# Patient Record
Sex: Female | Born: 1975 | Race: White | Hispanic: No | Marital: Single | Smoking: Current every day smoker
Health system: Southern US, Community
[De-identification: ages and names within clinical notes are randomized; demographics above are authoritative.]

## PROBLEM LIST (undated history)

## (undated) ENCOUNTER — Inpatient Hospital Stay (HOSPITAL_COMMUNITY): Payer: Self-pay

## (undated) DIAGNOSIS — E119 Type 2 diabetes mellitus without complications: Secondary | ICD-10-CM

## (undated) HISTORY — PX: NO PAST SURGERIES: SHX2092

---

## 2002-10-25 ENCOUNTER — Encounter: Payer: Self-pay | Admitting: Family Medicine

## 2002-10-25 ENCOUNTER — Ambulatory Visit (HOSPITAL_COMMUNITY): Admission: RE | Admit: 2002-10-25 | Discharge: 2002-10-25 | Payer: Self-pay | Admitting: Family Medicine

## 2012-06-25 DIAGNOSIS — R05 Cough: Secondary | ICD-10-CM

## 2016-03-28 ENCOUNTER — Inpatient Hospital Stay (HOSPITAL_COMMUNITY): Payer: Federal, State, Local not specified - PPO

## 2016-03-28 ENCOUNTER — Encounter (HOSPITAL_COMMUNITY): Payer: Self-pay | Admitting: *Deleted

## 2016-03-28 ENCOUNTER — Inpatient Hospital Stay (HOSPITAL_COMMUNITY)
Admission: AD | Admit: 2016-03-28 | Discharge: 2016-03-28 | Disposition: A | Discharge status: Home / Self Care | Payer: Federal, State, Local not specified - PPO | Source: Ambulatory Visit | Attending: Obstetrics & Gynecology | Admitting: Obstetrics & Gynecology

## 2016-03-28 DIAGNOSIS — B9689 Other specified bacterial agents as the cause of diseases classified elsewhere: Secondary | ICD-10-CM

## 2016-03-28 DIAGNOSIS — N760 Acute vaginitis: Secondary | ICD-10-CM | POA: Insufficient documentation

## 2016-03-28 DIAGNOSIS — O23591 Infection of other part of genital tract in pregnancy, first trimester: Secondary | ICD-10-CM | POA: Diagnosis not present

## 2016-03-28 DIAGNOSIS — Z3A08 8 weeks gestation of pregnancy: Secondary | ICD-10-CM | POA: Diagnosis not present

## 2016-03-28 DIAGNOSIS — O26899 Other specified pregnancy related conditions, unspecified trimester: Secondary | ICD-10-CM

## 2016-03-28 DIAGNOSIS — R109 Unspecified abdominal pain: Secondary | ICD-10-CM

## 2016-03-28 DIAGNOSIS — A499 Bacterial infection, unspecified: Secondary | ICD-10-CM | POA: Diagnosis not present

## 2016-03-28 HISTORY — DX: Type 2 diabetes mellitus without complications: E11.9

## 2016-03-28 LAB — URINALYSIS, ROUTINE W REFLEX MICROSCOPIC
Bilirubin Urine: NEGATIVE
Glucose, UA: NEGATIVE mg/dL
Ketones, ur: NEGATIVE mg/dL
Nitrite: NEGATIVE
Protein, ur: NEGATIVE mg/dL
Specific Gravity, Urine: 1.01 (ref 1.005–1.030)
pH: 7 (ref 5.0–8.0)

## 2016-03-28 LAB — URINE MICROSCOPIC-ADD ON

## 2016-03-28 LAB — HCG, QUANTITATIVE, PREGNANCY: hCG, Beta Chain, Quant, S: 59266 m[IU]/mL — ABNORMAL HIGH (ref <5)

## 2016-03-28 LAB — CBC WITH DIFFERENTIAL/PLATELET
Basophils Absolute: 0 10*3/uL (ref 0.0–0.1)
Basophils Relative: 0 %
EOS ABS: 0.1 10*3/uL (ref 0.0–0.7)
Eosinophils Relative: 1 %
HCT: 35.4 % — ABNORMAL LOW (ref 36.0–46.0)
HEMOGLOBIN: 12.5 g/dL (ref 12.0–15.0)
LYMPHS ABS: 2.5 10*3/uL (ref 0.7–4.0)
LYMPHS PCT: 25 %
MCH: 31.9 pg (ref 26.0–34.0)
MCHC: 35.3 g/dL (ref 30.0–36.0)
MCV: 90.3 fL (ref 78.0–100.0)
Monocytes Absolute: 0.8 10*3/uL (ref 0.1–1.0)
Monocytes Relative: 8 %
NEUTROS PCT: 66 %
Neutro Abs: 6.6 10*3/uL (ref 1.7–7.7)
Platelets: 269 10*3/uL (ref 150–400)
RBC: 3.92 MIL/uL (ref 3.87–5.11)
RDW: 13.1 % (ref 11.5–15.5)
WBC: 10 10*3/uL (ref 4.0–10.5)

## 2016-03-28 LAB — WET PREP, GENITAL
SPERM: NONE SEEN
Trich, Wet Prep: NONE SEEN
Yeast Wet Prep HPF POC: NONE SEEN

## 2016-03-28 LAB — ABO/RH: ABO/RH(D): A POS

## 2016-03-28 LAB — POCT PREGNANCY, URINE: Preg Test, Ur: POSITIVE — AB

## 2016-03-28 MED ORDER — OXYCODONE-ACETAMINOPHEN 5-325 MG PO TABS: 1.0000 | ORAL_TABLET | Freq: Four times a day (QID) | ORAL | Status: AC | PRN

## 2016-03-28 MED ORDER — HYDROMORPHONE HCL 1 MG/ML IJ SOLN
1.0000 mg | Freq: Once | INTRAMUSCULAR | Status: AC
  Administered 2016-03-28: 1 mg via INTRAMUSCULAR
  Filled 2016-03-28: qty 1

## 2016-03-28 MED ORDER — METRONIDAZOLE 500 MG PO TABS: 500.0000 mg | ORAL_TABLET | Freq: Two times a day (BID) | ORAL | Status: AC

## 2016-03-28 NOTE — MAU Note (Signed)
Patient took +HPT and has been having cramping x1 week.  Today the cramping is worse, especially on right side and patient having back pain.

## 2016-03-28 NOTE — MAU Provider Note (Signed)
History     CSN: [id number]  Arrival date and time: 03/28/16 1710   First Provider Initiated Contact with Patient 03/28/16 1730      Chief Complaint  Patient presents with  . Abdominal Pain  . Back Pain   HPI Ms. Kathryn Blackburn is a 40 y.o. [id number] at [redacted]w[redacted]d who presents to MAU today with complaint of abdominal pain. The patient states recent +HPT. 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 She states she has been having lower abdominal cramping for 1-2 weeks. She states pain is much worse today. She has also noted pink and brown discharge. She has not taken anything for pain. She denies N/V/D or constipation, UTI symptoms or fever.    OB History    Gravida Para Term Preterm AB TAB SAB Ectopic Multiple Living   10 5 4 1 4 2 2  0 0 4      Past Medical History  Diagnosis Date  . Diabetes mellitus without complication (HCC)     previous pregnancy    Past Surgical History  Procedure Laterality Date  . No past surgeries      No family history on file.  Social History  Substance Use Topics  . Smoking status: Current Every Day Smoker -- 1.00 packs/day    Types: Cigarettes  . Smokeless tobacco: None  . Alcohol Use: Yes     Comment: 1 drink/month    Allergies: No Known Allergies  Prescriptions prior to admission  Medication Sig Dispense Refill Last Dose  . albuterol (PROVENTIL HFA;VENTOLIN HFA) 108 (90 Base) MCG/ACT inhaler Inhale 2 puffs into the lungs every 6 (six) hours as needed for wheezing or shortness of breath.   rescue  . Fluticasone-Salmeterol (ADVAIR) 250-50 MCG/DOSE AEPB Inhale 1 puff into the lungs 2 (two) times daily.   03/28/2016 at Unknown time    Review of Systems  Constitutional: Negative for fever and malaise/fatigue.  Gastrointestinal:  Positive for abdominal pain. Negative for nausea, vomiting, diarrhea and constipation.  Genitourinary: Negative for dysuria, urgency and frequency.       + spotting   Physical Exam   Blood pressure 112/73, pulse 95, temperature 98.1 F (36.7 C), temperature source Oral, resp. rate 16, height 5\' 4"  (1.626 m), weight 142 lb (64.411 kg), last menstrual period 01/30/2016.  Physical Exam  Nursing note and vitals reviewed. Constitutional: She is oriented to person, place, and time. She appears well-developed and well-nourished. No distress.  HENT:  Head: Normocephalic and atraumatic.  Cardiovascular: Normal rate.   Respiratory: Effort normal.  GI: Soft. She exhibits no distension and no mass. There is tenderness (moderate tenderness to palpation of the lower abdomen bilaterally with diffuse mild tenderness to palpation of the upper abdomen). There is no rebound and no guarding.  Genitourinary: Uterus is not enlarged and not tender. Cervix exhibits no motion tenderness, no discharge and no friability. Right adnexum displays tenderness. Right adnexum displays no mass. Left adnexum displays no mass and no tenderness. There is bleeding (scant pink, brown) in the vagina. Vaginal discharge (scant) found.  Neurological: She is alert and oriented to person, place, and time.  Skin: Skin is warm and dry. No erythema.  Psychiatric: She has a normal mood and affect.    Results  for orders placed or performed during the hospital encounter of 03/28/16 (from the past 48 hour(s))  Urinalysis, Routine w reflex microscopic (not at [facility])     Status: Abnormal   Collection Time: 03/28/16  5:20 PM  Result Value Ref Range   Color, Urine YELLOW YELLOW   APPearance HAZY (A) CLEAR   Specific Gravity, Urine 1.010 1.005 - 1.030   pH 7.0 5.0 - 8.0   Glucose, UA NEGATIVE NEGATIVE mg/dL   Hgb urine dipstick TRACE (A) NEGATIVE   Bilirubin Urine NEGATIVE NEGATIVE   Ketones, ur NEGATIVE NEGATIVE mg/dL   Protein, ur  NEGATIVE NEGATIVE mg/dL   Nitrite NEGATIVE NEGATIVE   Leukocytes, UA LARGE (A) NEGATIVE  Urine microscopic-add on     Status: Abnormal   Collection Time: 03/28/16  5:20 PM  Result Value Ref Range   Squamous Epithelial / LPF 0-5 (A) NONE SEEN   WBC, UA 6-30 0 - 5 WBC/hpf   RBC / HPF 0-5 0 - 5 RBC/hpf   Bacteria, UA FEW (A) NONE SEEN  Pregnancy, urine POC     Status: Abnormal   Collection Time: 03/28/16  5:28 PM  Result Value Ref Range   Preg Test, Ur POSITIVE (A) NEGATIVE    Comment:        THE SENSITIVITY OF THIS METHODOLOGY IS >24 mIU/mL   Wet prep, genital     Status: Abnormal   Collection Time: 03/28/16  5:40 PM  Result Value Ref Range   Yeast Wet Prep HPF POC NONE SEEN NONE SEEN   Trich, Wet Prep NONE SEEN NONE SEEN   Clue Cells Wet Prep HPF POC PRESENT (A) NONE SEEN   WBC, Wet Prep HPF POC MODERATE (A) NONE SEEN    Comment: MANY BACTERIA SEEN   Sperm NONE SEEN   CBC with Differential/Platelet     Status: Abnormal   Collection Time: 03/28/16  5:58 PM  Result Value Ref Range   WBC 10.0 4.0 - 10.5 K/uL   RBC 3.92 3.87 - 5.11 MIL/uL   Hemoglobin 12.5 12.0 - 15.0 g/dL   HCT 16.1 (L) 09.6 - 04.5 %   MCV 90.3 78.0 - 100.0 fL   MCH 31.9 26.0 - 34.0 pg   MCHC 35.3 30.0 - 36.0 g/dL   RDW 40.9 81.1 - 91.4 %   Platelets 269 150 - 400 K/uL   Neutrophils Relative % 66 %   Neutro Abs 6.6 1.7 - 7.7 K/uL   Lymphocytes Relative 25 %   Lymphs Abs 2.5 0.7 - 4.0 K/uL   Monocytes Relative 8 %   Monocytes Absolute 0.8 0.1 - 1.0 K/uL   Eosinophils Relative 1 %   Eosinophils Absolute 0.1 0.0 - 0.7 K/uL   Basophils Relative 0 %   Basophils Absolute 0.0 0.0 - 0.1 K/uL   US Ob Comp Less 14 Wks  03/28/2016  CLINICAL DATA:  Right lower abdominal and back pain for 1 week. Gestational age by LMP of 8 weeks 2 days. EXAM: OBSTETRIC <14 WK [place] AND TRANSVAGINAL OB US TECHNIQUE: Both transabdominal and transvaginal ultrasound examinations were performed for complete evaluation of the gestation as  well as the maternal uterus, adnexal regions, and pelvic cul-de-sac. Transvaginal technique was performed to assess early pregnancy. COMPARISON:  None. FINDINGS: Intrauterine gestational sac: Single Yolk sac:  Visualized Embryo:  Visualized Cardiac Activity: Visualized Heart Rate: 162  bpm CRL:  15  mm   7 w   6 d  US EDC: 11/08/2016 Subchorionic hemorrhage:  None visualized. Maternal uterus/adnexae: Both ovaries are normal in appearance. No mass or free fluid identified. IMPRESSION: Single living IUP measuring 7 weeks 6 days with US EDC of 11/08/2016. This is concordant with LMP. No significant maternal uterine or adnexal abnormality identified. Electronically Signed   By: Myles RosenthalJohn  Stahl M.D.   On: 03/28/2016 18:40   [place] Ob Transvaginal  03/28/2016  CLINICAL DATA:  Right lower abdominal and back pain for 1 week. Gestational age by LMP of 8 weeks 2 days. EXAM: OBSTETRIC <14 WK US AND TRANSVAGINAL OB US TECHNIQUE: Both transabdominal and transvaginal ultrasound examinations were performed for complete evaluation of the gestation as well as the maternal uterus, adnexal regions, and pelvic cul-de-sac. Transvaginal technique was performed to assess early pregnancy. COMPARISON:  None. FINDINGS: Intrauterine gestational sac: Single Yolk sac:  Visualized Embryo:  Visualized Cardiac Activity: Visualized Heart Rate: 162  bpm CRL:  15  mm   7 w   6 d                  US EDC: 11/08/2016 Subchorionic hemorrhage:  None visualized. Maternal uterus/adnexae: Both ovaries are normal in appearance. No mass or free fluid identified. IMPRESSION: Single living IUP measuring 7 weeks 6 days with US EDC of 11/08/2016. This is concordant with LMP. No significant maternal uterine or adnexal abnormality identified. Electronically Signed   By: Myles RosenthalJohn  Stahl M.D.   On: 03/28/2016 18:40    MAU Course  Procedures None  MDM +UPT UA, wet prep, GC/chlamydia, CBC, ABO/Rh, quant hCG, HIV, RPR and US today to rule out ectopic  pregnancy Urine culture pending Patient plans to terminate the pregnancy. She has an appointment within the week.  ABO/Rh pending, patient states she has not required Rhogam with other pregnancies. Will discharge with understanding that she will need to return if Rh negative Assessment and Plan  A: SIUP at 739w2d Abdominal pain in pregnancy Bacterial vaginosis  P: Discharge home Rx for Flagyl and Percocet given to patient  First trimester precautions discussed Patient advised to follow-up with OB provider of choice. Contact information for WOC given.  Patient may return to MAU as needed or if her condition were to change or worsen  Marny LowensteinJulie N Wenzel, PA-C  03/28/2016, 7:08 PM

## 2016-03-28 NOTE — Discharge Instructions (Signed)
Bacterial Vaginosis °Bacterial vaginosis is an infection of the vagina. It happens when too many germs (bacteria) grow in the vagina. Having this infection puts you at risk for getting other infections from sex. Treating this infection can help lower your risk for other infections, such as:  °· Chlamydia. °· Gonorrhea. °· HIV. °· Herpes. °HOME CARE °· Take your medicine as told by your doctor. °· Finish your medicine even if you start to feel better. °· Tell your sex partner that you have an infection. They should see their doctor for treatment. °· During treatment: °¨ Avoid sex or use condoms correctly. °¨ Do not douche. °¨ Do not drink alcohol unless your doctor tells you it is ok. °¨ Do not breastfeed unless your doctor tells you it is ok. °GET HELP IF: °· You are not getting better after 3 days of treatment. °· You have more grey fluid (discharge) coming from your vagina than before. °· You have more pain than before. °· You have a fever. °MAKE SURE YOU:  °· Understand these instructions. °· Will watch your condition. °· Will get help right away if you are not doing well or get worse. °  °This information is not intended to replace advice given to you by your health care provider. Make sure you discuss any questions you have with your health care provider. °  °Document Released: 08/12/2008 Document Revised: 11/24/2014 Document Reviewed: 06/15/2013 °Elsevier Interactive Patient Education ©2016 [employer] °First Trimester of Pregnancy °The first trimester of pregnancy is from week 1 until the end of week 12 (months 1 through 3). During this time, your baby will begin to develop inside you. At 6-8 weeks, the eyes and face are formed, and the heartbeat can be seen on ultrasound. At the end of 12 weeks, all the baby's organs are formed. Prenatal care is all the medical care you receive before the birth of your baby. Make sure you get good prenatal care and follow all of your doctor's instructions. °HOME CARE    °Medicines °· Take medicine only as told by your doctor. Some medicines are safe and some are not during pregnancy. °· Take your prenatal vitamins as told by your doctor. °· Take medicine that helps you poop (stool softener) as needed if your doctor says it is okay. °Diet °· Eat regular, healthy meals. °· Your doctor will tell you the amount of weight gain that is right for you. °· Avoid raw meat and uncooked cheese. °· If you feel sick to your stomach (nauseous) or throw up (vomit): °¨ Eat 4 or 5 small meals a day instead of 3 large meals. °¨ Try eating a few soda crackers. °¨ Drink liquids between meals instead of during meals. °· If you have a hard time pooping (constipation): °¨ Eat high-fiber foods like fresh vegetables, fruit, and whole grains. °¨ Drink enough fluids to keep your pee (urine) clear or pale yellow. °Activity and Exercise °· Exercise only as told by your doctor. Stop exercising if you have cramps or pain in your lower belly (abdomen) or low back. °· Try to avoid standing for long periods of time. Move your legs often if you must stand in one place for a long time. °· Avoid heavy lifting. °· Wear low-heeled shoes. Sit and stand up straight. °· You can have sex unless your doctor tells you not to. °Relief of Pain or Discomfort °· Wear a good support bra if your breasts are sore. °· Take warm water baths (sitz baths)   to soothe pain or discomfort caused by hemorrhoids. Use hemorrhoid cream if your doctor says it is okay. °· Rest with your legs raised if you have leg cramps or low back pain. °· Wear support hose if you have puffy, bulging veins (varicose veins) in your legs. Raise (elevate) your feet for 15 minutes, 3-4 times a day. Limit salt in your diet. °Prenatal Care °· Schedule your prenatal visits by the twelfth week of pregnancy. °· Write down your questions. Take them to your prenatal visits. °· Keep all your prenatal visits as told by your doctor. °Safety °· Wear your seat belt at all times  when driving. °· Make a list of emergency phone numbers. The list should include numbers for family, friends, the hospital, and police and fire departments. °General Tips °· Ask your doctor for a referral to a local prenatal class. Begin classes no later than at the start of month 6 of your pregnancy. °· Ask for help if you need counseling or help with nutrition. Your doctor can give you advice or tell you where to go for help. °· Do not use hot tubs, steam rooms, or saunas. °· Do not douche or use tampons or scented sanitary pads. °· Do not cross your legs for long periods of time. °· Avoid litter boxes and soil used by cats. °· Avoid all smoking, herbs, and alcohol. Avoid drugs not approved by your doctor. °· Do not use any tobacco products, including cigarettes, chewing tobacco, and electronic cigarettes. If you need help quitting, ask your doctor. You may get counseling or other support to help you quit. °· Visit your dentist. At home, brush your teeth with a soft toothbrush. Be gentle when you floss. °GET HELP IF: °· You are dizzy. °· You have mild cramps or pressure in your lower belly. °· You have a nagging pain in your belly area. °· You continue to feel sick to your stomach, throw up, or have watery poop (diarrhea). °· You have a bad smelling fluid coming from your vagina. °· You have pain with peeing (urination). °· You have increased puffiness (swelling) in your face, hands, legs, or ankles. °GET HELP RIGHT AWAY IF:  °· You have a fever. °· You are leaking fluid from your vagina. °· You have spotting or bleeding from your vagina. °· You have very bad belly cramping or pain. °· You gain or lose weight rapidly. °· You throw up blood. It may look like coffee grounds. °· You are around people who have German measles, fifth disease, or chickenpox. °· You have a very bad headache. °· You have shortness of breath. °· You have any kind of trauma, such as from a fall or a car accident. °  °This information is not  intended to replace advice given to you by your health care provider. Make sure you discuss any questions you have with your health care provider. °  °Document Released: 04/21/2008 Document Revised: 11/24/2014 Document Reviewed: 09/13/2013 °Elsevier Interactive Patient Education ©2016 [employer] ° °

## 2016-03-29 LAB — HIV ANTIBODY (ROUTINE TESTING W REFLEX): HIV Screen 4th Generation wRfx: NONREACTIVE

## 2016-03-29 LAB — RPR: RPR: NONREACTIVE

## 2016-03-31 LAB — GC/CHLAMYDIA PROBE AMP (~~LOC~~) NOT AT ARMC
Chlamydia: POSITIVE — AB
NEISSERIA GONORRHEA: NEGATIVE

## 2016-03-31 LAB — CULTURE, OB URINE: Culture: >=100000 — AB

## 2016-04-01 ENCOUNTER — Telehealth: Payer: Self-pay | Admitting: Advanced Practice Midwife

## 2016-04-01 ENCOUNTER — Encounter: Payer: Self-pay | Admitting: Advanced Practice Midwife

## 2016-04-01 DIAGNOSIS — O2340 Unspecified infection of urinary tract in pregnancy, unspecified trimester: Secondary | ICD-10-CM

## 2016-04-01 DIAGNOSIS — B951 Streptococcus, group B, as the cause of diseases classified elsewhere: Secondary | ICD-10-CM | POA: Insufficient documentation

## 2016-04-01 MED ORDER — SULFAMETHOXAZOLE-TRIMETHOPRIM 800-160 MG PO TABS
1.0000 | ORAL_TABLET | Freq: Two times a day (BID) | ORAL | Status: AC
Start: 2016-04-01 — End: ?

## 2016-04-01 MED ORDER — AZITHROMYCIN 500 MG PO TABS: 1000.0000 mg | ORAL_TABLET | Freq: Once | ORAL | Status: AC

## 2016-04-01 NOTE — Telephone Encounter (Signed)
Called pt to notify her of positive urine culture.  Pt returned call and reviewed positive urine culture AND positive chlamydia test with pt. Rx printed for both for pt to pick up at [facility]U front desk today.  Rx for pt partner written as well.

## 2017-01-31 ENCOUNTER — Encounter (HOSPITAL_COMMUNITY): Payer: Self-pay

## 2017-09-06 IMAGING — US US OB TRANSVAGINAL
1 series · 15 of 28 positions shown · non-contrast
Comparison: None.

CLINICAL DATA: Right lower abdominal and back pain for 1 week.
Gestational age by LMP of 8 weeks 2 days.

EXAM:
OBSTETRIC <14 WK US AND TRANSVAGINAL OB US
TECHNIQUE: Both transabdominal and transvaginal ultrasound examinations were
performed for complete evaluation of the gestation as well as the
maternal uterus, adnexal regions, and pelvic cul-de-sac.
Transvaginal technique was performed to assess early pregnancy.

[Series 1: us ob transvaginal · 15 of 58 slices shown]
[im 1/58]
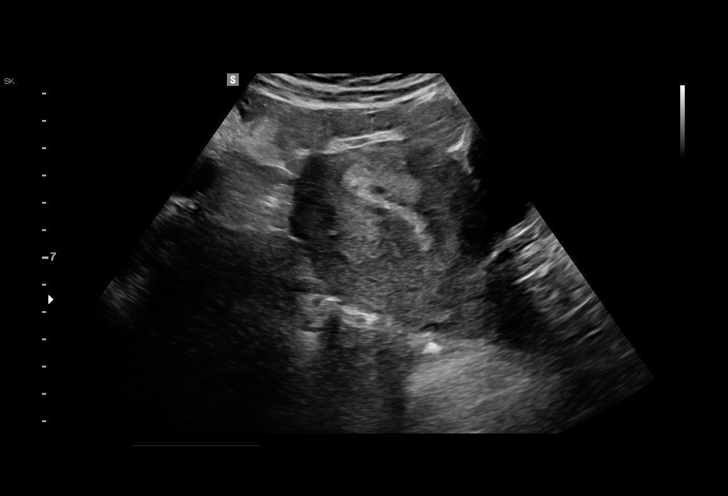
[im 5/58]
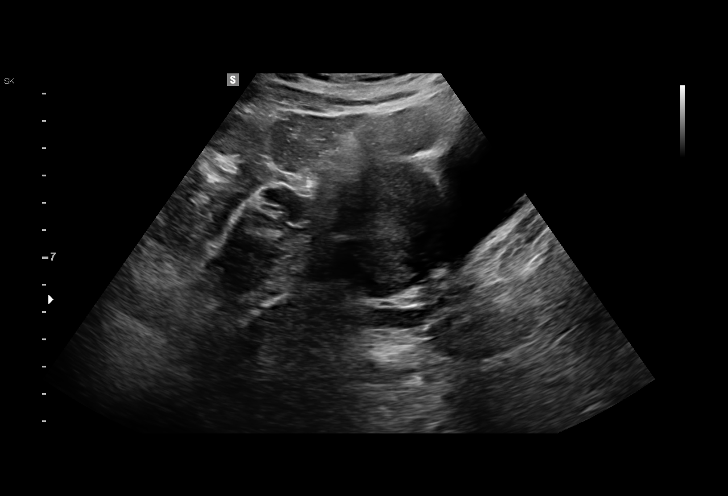
[im 9/58]
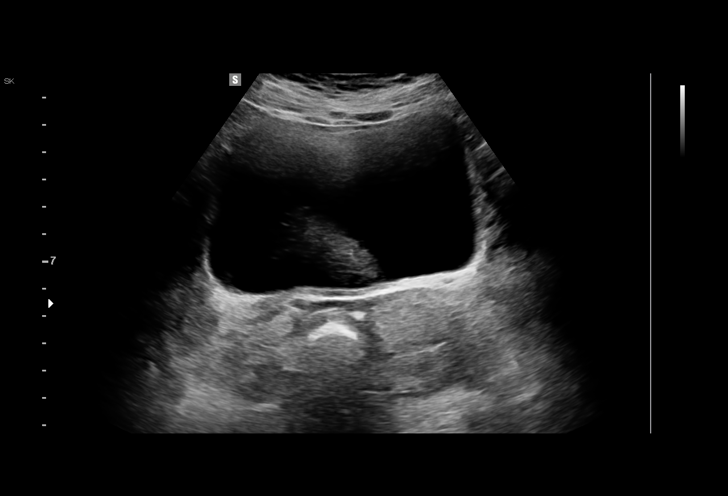
[im 13/58]
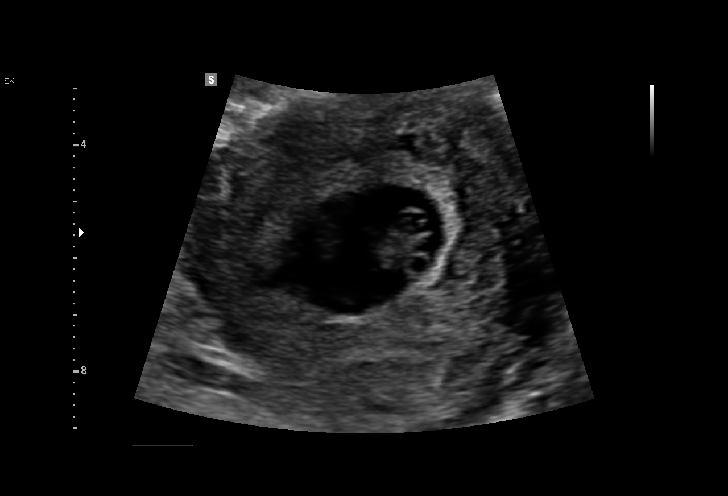
[im 17/58]
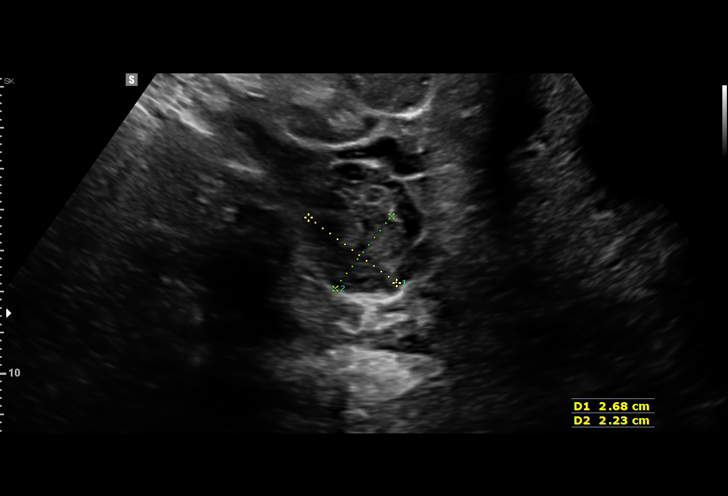
[im 22/58]
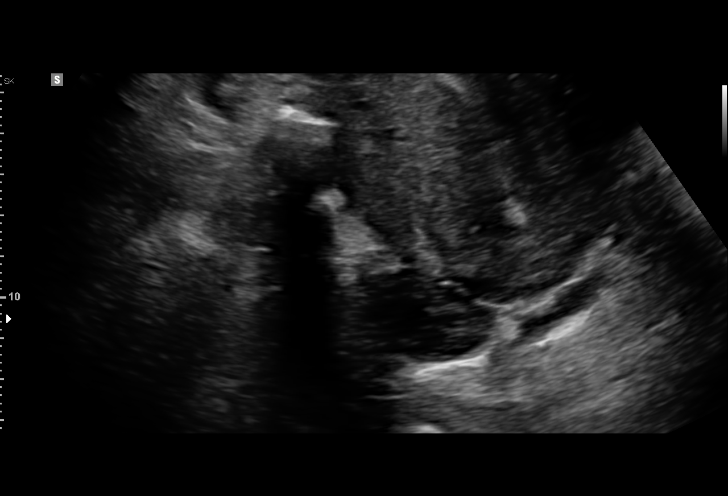
[im 26/58]
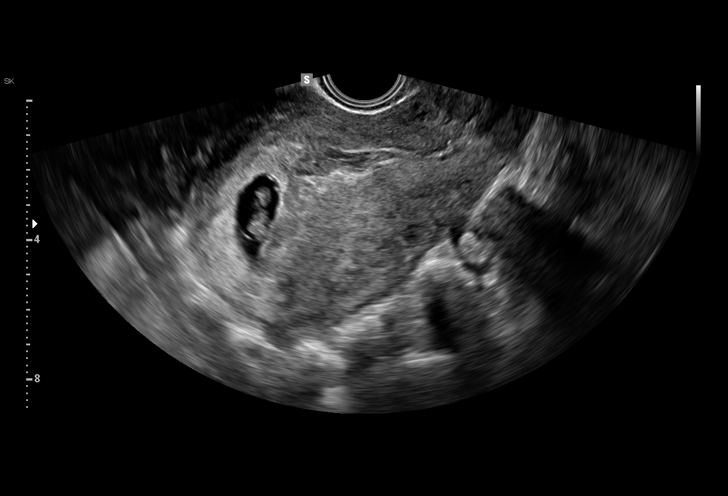
[im 30/58]
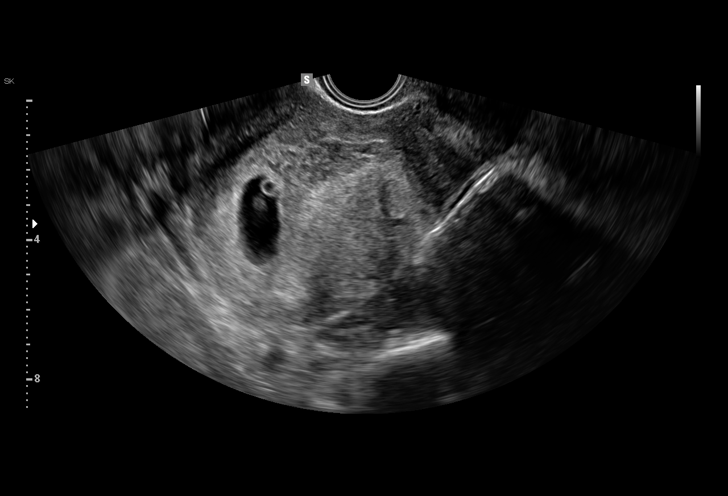
[im 32/58]
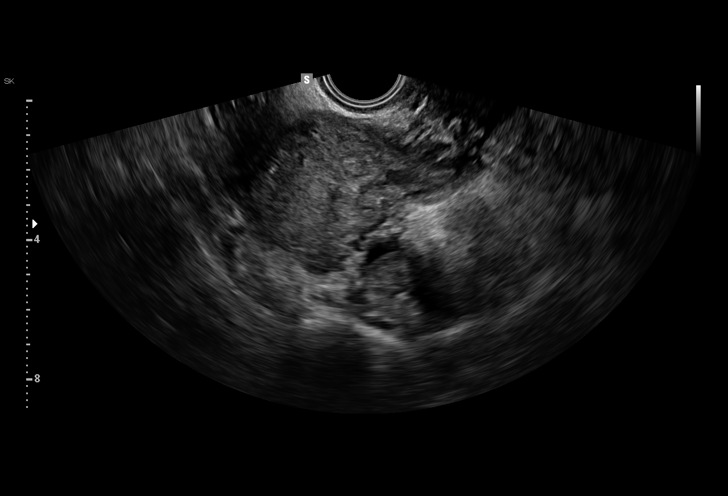
[im 36/58]
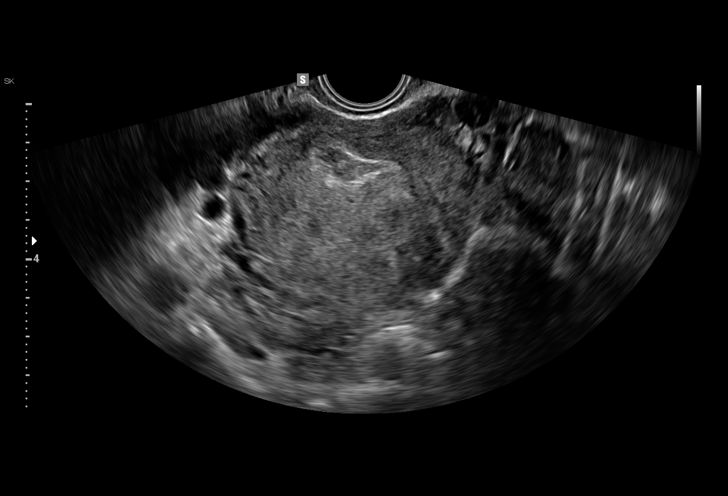
[im 41/58]
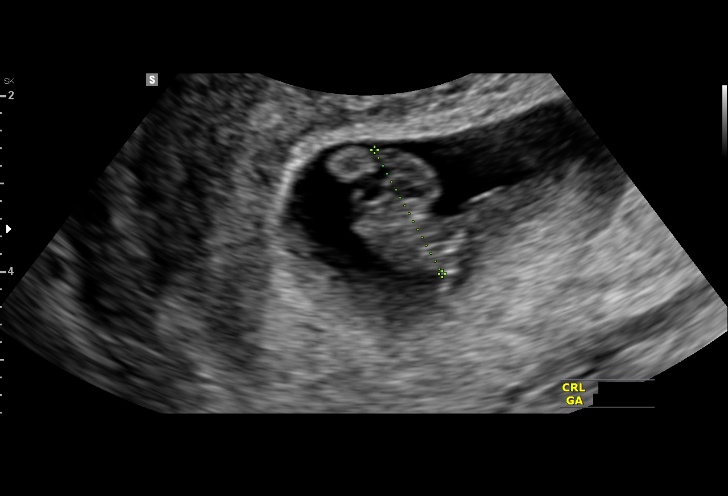
[im 45/58]
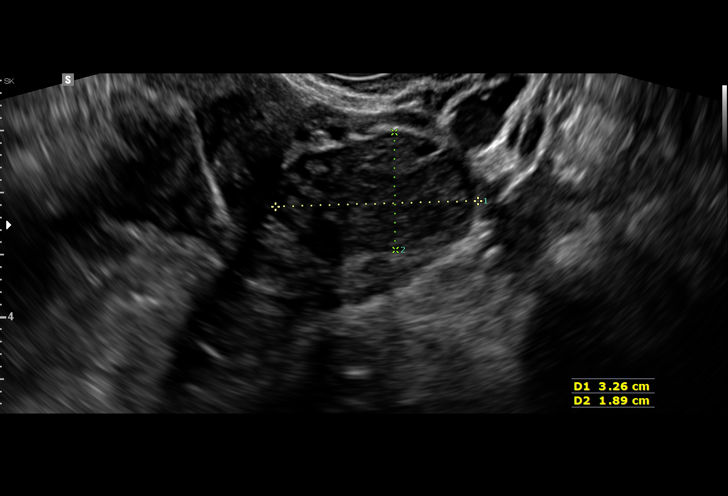
[im 49/58]
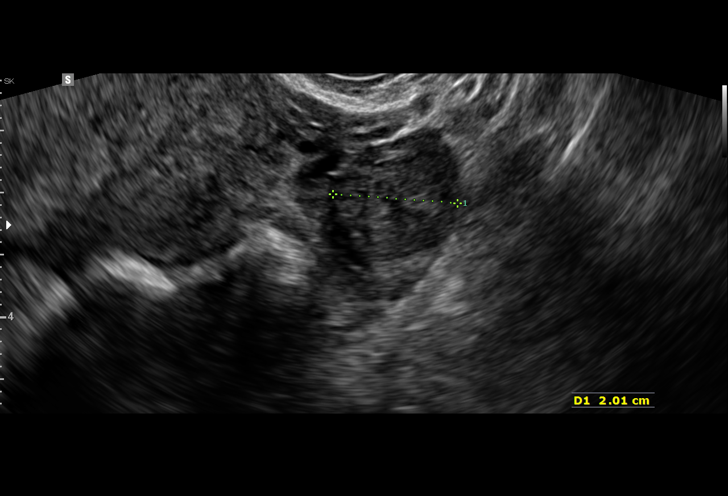
[im 53/58]
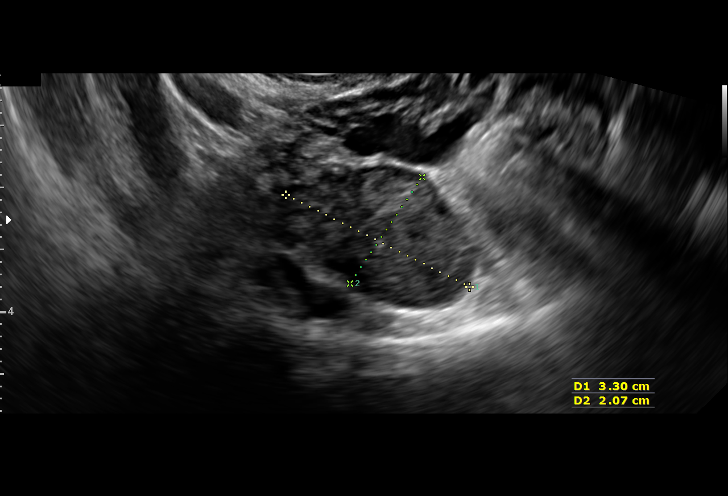
[im 58/58]
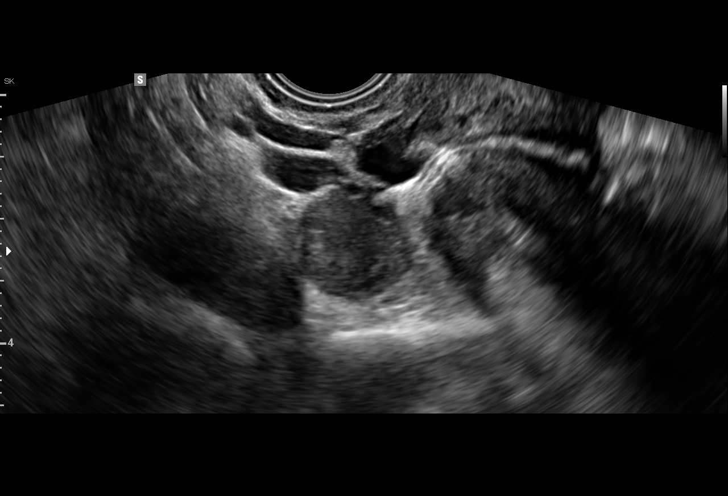

[15 of 28 positions shown; findings below may reference images not displayed]

FINDINGS: Intrauterine gestational sac: Single

Yolk sac:  Visualized

Embryo:  Visualized

Cardiac Activity: Visualized

Heart Rate: 162  bpm

CRL:  15  mm   7 w   6 d                  US EDC: 11/08/2016

Subchorionic hemorrhage:  None visualized.

Maternal uterus/adnexae: Both ovaries are normal in appearance. No
mass or free fluid identified.
IMPRESSION: Single living IUP measuring 7 weeks 6 days with US EDC of
11/08/2016. This is concordant with LMP.

No significant maternal uterine or adnexal abnormality identified.
